# Patient Record
Sex: Male | Born: 2005 | Race: Black or African American | Hispanic: No | Marital: Single | Smoking: Current every day smoker
Health system: Southern US, Community
[De-identification: ages and names within clinical notes are randomized; demographics above are authoritative.]

## PROBLEM LIST (undated history)

## (undated) DIAGNOSIS — K219 Gastro-esophageal reflux disease without esophagitis: Secondary | ICD-10-CM

## (undated) DIAGNOSIS — F909 Attention-deficit hyperactivity disorder, unspecified type: Secondary | ICD-10-CM

## (undated) DIAGNOSIS — T7840XA Allergy, unspecified, initial encounter: Secondary | ICD-10-CM

## (undated) DIAGNOSIS — Z9109 Other allergy status, other than to drugs and biological substances: Secondary | ICD-10-CM

## (undated) HISTORY — PX: TONSILLECTOMY AND ADENOIDECTOMY: SUR1326

## (undated) HISTORY — PX: TYMPANOSTOMY TUBE PLACEMENT: SHX32

## (undated) HISTORY — PX: TONSILECTOMY, ADENOIDECTOMY, BILATERAL MYRINGOTOMY AND TUBES: SHX2538

---
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
## 2024-02-26 ENCOUNTER — Ambulatory Visit

## 2024-02-26 NOTE — Progress Notes (Deleted)
 New patient visit   Patient: Steve Chung   DOB: 11/22/2005   18 y.o. Male  MRN: [id number] Visit Date: 02/26/2024  Today's healthcare provider: Isaiah DELENA Pepper, MD   No chief complaint on file.  Subjective    Steve Chung is a 18 y.o. male who presents today as a new patient to establish care.   Discussed the use of AI scribe software for clinical note transcription with the patient, who gave verbal consent to proceed.  History of Present Illness      Past Medical History:  Diagnosis Date   ADHD (attention deficit hyperactivity disorder)    Allergy    Asthma    Environmental allergies    GERD (gastroesophageal reflux disease)    Past Surgical History:  Procedure Laterality Date   TONSILECTOMY, ADENOIDECTOMY, BILATERAL MYRINGOTOMY AND TUBES     TONSILLECTOMY AND ADENOIDECTOMY     TYMPANOSTOMY TUBE PLACEMENT     Family Status  Relation Name Status   Mother Wyline Mace Alive   Father Reichen Hutzler Deceased   Brother Gershon Mace Alive   Brother Kamier [place] Alive   Mat Aunt  (Not Specified)   MGF  (Not Specified)  No partnership data on file   Family History  Problem Relation Age of Onset   Autism spectrum disorder Brother    Asthma Maternal Aunt    Asthma Maternal Grandfather    Social History   Socioeconomic History   Marital status: Single    Spouse name: Not on file   Number of children: Not on file   Years of education: Not on file   Highest education level: Not on file  Occupational History   Not on file  Tobacco Use   Smoking status: Every Day    Types: E-cigarettes    Passive exposure: Yes   Smokeless tobacco: Current   Tobacco comments:    Vaping   Vaping Use   Vaping status: Never Used  Substance and Sexual Activity   Alcohol use: No    Alcohol/week: 0.0 standard drinks of alcohol   Drug use: No   Sexual activity: Never  Other Topics Concern   Not on file  Social History Narrative   Lives with mother and brothers; father  is deceased as of July 05, 2019.   Social Drivers of Corporate investment banker Strain: Not on file  Food Insecurity: Not on file  Transportation Needs: Not on file  Physical Activity: Not on file  Stress: Not on file  Social Connections: Not on file   Outpatient Medications Prior to Visit  Medication Sig   albuterol  (VENTOLIN  HFA) 108 (90 Base) MCG/ACT inhaler Inhale 2 puffs into the lungs every 4 (four) hours as needed for wheezing or shortness of breath.   BREZTRI  AEROSPHERE 160-9-4.8 MCG/ACT AERO INHALE 2 PUFFS INTO THE LUNGS IN THE MORNING AND AT BEDTIME. (Patient not taking: Reported on 11/13/2023)   CALCIUM PO Take 1 tablet by mouth daily. (Patient not taking: Reported on 01/27/2022)   cetirizine  (ZYRTEC ) 10 MG tablet Take one tablet by mouth once a day at bedtime to control allergy symptoms   Cholecalciferol (VITAMIN D3 PO) Take 1 tablet by mouth daily. (Patient not taking: Reported on 01/27/2022)   EPINEPHrine  0.3 mg/0.3 mL IJ SOAJ injection Inject 0.3 mg into the muscle as needed for anaphylaxis.   fluticasone  (FLONASE ) 50 MCG/ACT nasal spray PLACE 1-2 SPRAYS INTO BOTH NOSTRILS DAILY. SNIFF ONE SPRAY INTO EACH NOSTRIL ONCE A DAY FOR ALLERGY  CONTROL   Multiple Vitamin (MULTIVITAMIN WITH MINERALS) TABS tablet Take 1 tablet by mouth daily. (Patient not taking: Reported on 01/27/2022)   OVER THE COUNTER MEDICATION Take 1 mL by mouth 2 (two) times daily. Focus for ADHD (Patient not taking: Reported on 01/27/2022)   RETIN-A  0.025 % cream Apply to areas of acne on clean, dry face at bedtime as needed.  Use sunscreen each am. (Patient not taking: Reported on 01/27/2022)   No facility-administered medications prior to visit.   Allergies  Allergen Reactions   Fish Allergy Anaphylaxis   Shellfish Allergy Anaphylaxis and Swelling   Amoxil [Amoxicillin] Other (See Comments)    Per allergy test results   Cat Dander Other (See Comments)    Per allergy test results   Penicillins Other (See  Comments)    Per allergy test results   Mold Extract [Trichophyton Mentagrophyte] Other (See Comments)    Allergic to Grass, trees, mold per allergy test results   Trichophyton Other (See Comments)    Allergic to Grass, trees, mold per allergy test results  Grass, trees, mold    Reviews of Systems as noted in HPI.  {Insert previous labs (optional):23779} {See past labs  Heme  Chem  Endocrine  Serology  Results Review (optional):1}   Objective    There were no vitals taken for this visit. {Insert last BP/Wt (optional):23777}{See vitals history (optional):1}   Physical Exam  Depression Screen    11/07/2022    1:39 PM 08/21/2021    9:02 AM 03/12/2020   12:25 PM 03/07/2019   11:56 AM  PHQ 2/9 Scores  PHQ - 2 Score 1 1 0 0  PHQ- 9 Score 5 3 1 6    No results found for any visits on 02/26/24.  Assessment & Plan      Problem List Items Addressed This Visit   None   Assessment and Plan Assessment & Plan      No follow-ups on file.      Isaiah DELENA Pepper, MD  [facility] [phone] (phone) [phone] (fax)

## 2024-04-03 ENCOUNTER — Ambulatory Visit (INDEPENDENT_AMBULATORY_CARE_PROVIDER_SITE_OTHER)

## 2024-04-03 VITALS — BP 119/70 | HR 60 | Resp 16 | Ht 70.0 in | Wt 156.0 lb

## 2024-04-03 DIAGNOSIS — Z91018 Allergy to other foods: Secondary | ICD-10-CM | POA: Insufficient documentation

## 2024-04-03 DIAGNOSIS — F322 Major depressive disorder, single episode, severe without psychotic features: Secondary | ICD-10-CM | POA: Diagnosis not present

## 2024-04-03 DIAGNOSIS — Z23 Encounter for immunization: Secondary | ICD-10-CM | POA: Diagnosis not present

## 2024-04-03 DIAGNOSIS — F17200 Nicotine dependence, unspecified, uncomplicated: Secondary | ICD-10-CM | POA: Insufficient documentation

## 2024-04-03 DIAGNOSIS — J302 Other seasonal allergic rhinitis: Secondary | ICD-10-CM | POA: Diagnosis not present

## 2024-04-03 DIAGNOSIS — J4540 Moderate persistent asthma, uncomplicated: Secondary | ICD-10-CM

## 2024-04-03 DIAGNOSIS — F17290 Nicotine dependence, other tobacco product, uncomplicated: Secondary | ICD-10-CM

## 2024-04-03 MED ORDER — CETIRIZINE HCL 10 MG PO TABS: ORAL_TABLET | ORAL | 2 refills | Status: AC

## 2024-04-03 MED ORDER — ALBUTEROL SULFATE HFA 108 (90 BASE) MCG/ACT IN AERS: 2.0000 | INHALATION_SPRAY | RESPIRATORY_TRACT | 2 refills | Status: AC | PRN

## 2024-04-03 MED ORDER — BUPROPION HCL ER (XL) 150 MG PO TB24: 150.0000 mg | ORAL_TABLET | Freq: Every day | ORAL | 3 refills | Status: AC

## 2024-04-03 MED ORDER — FLUTICASONE PROPIONATE 50 MCG/ACT NA SUSP: 1.0000 | Freq: Every day | NASAL | 5 refills | Status: AC

## 2024-04-03 NOTE — Progress Notes (Signed)
 New patient visit  Patient: Steve Chung   DOB: 2006-02-04   18 y.o. Male  MRN: [id number] Visit Date: 04/03/2024  Today's healthcare provider: Isaiah DELENA Pepper, MD   Chief Complaint  Patient presents with   New Patient (Initial Visit)    NP/Est Care/Labs/ Asthma   Subjective    Steve Chung is a 18 y.o. male who presents today as a new patient to establish care.   Discussed the use of AI scribe software for clinical note transcription with the patient, who gave verbal consent to proceed.  History of Present Illness Steve Chung is a 18 year old male who presents to establish care.  He is concerned about his liver health after watching a TikTok video. He experiences symptoms of dehydration despite drinking a lot of water, with foamy and yellow urine even after consuming eight bottles of water. He has attempted detoxes but continues to experience these symptoms.  He experiences feelings of being down most days, affecting his motivation to engage in activities outside. He has difficulty sleeping, either struggling to fall asleep or waking up early and being unable to return to sleep. He also describes fluctuations in his appetite, sometimes not wanting to eat and other times overeating. He expresses feelings of inadequacy and thoughts of being better off not being here. He attributes some of these feelings to the loss of his father at age 68.  He has hx of asthma but has not been using his inhaler. He has a history of asthma, which was severe in the past, and experiences shortness of breath and wheezing during exercise, particularly when running. He vapes but does not smoke, and he believes vaping might contribute to his respiratory issues. He does not regularly use his inhaler during exercise despite experiencing symptoms.  He has a history of a significant accident where he fell from a height of 30-40 feet, resulting in a brief loss of consciousness and a friend's severe injury. He did  not sustain any broken bones despite the severity of the accident.  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 He has been out of school for two years and is currently unemployed, which he finds distressing.   Past Medical History:  Diagnosis Date   ADHD (attention deficit hyperactivity disorder)    Allergy    Asthma    Environmental allergies    GERD (gastroesophageal reflux disease)    Past Surgical History:  Procedure Laterality Date   TONSILECTOMY, ADENOIDECTOMY, BILATERAL MYRINGOTOMY AND TUBES     TONSILLECTOMY AND ADENOIDECTOMY     TYMPANOSTOMY TUBE PLACEMENT     Family Status  Relation Name Status   Mother Wyline Mace Alive   Father Tramon Crescenzo Deceased   Brother Gershon Mace Alive   Brother Kamier [place] Alive   Mat Aunt  (Not Specified)   MGF  (Not Specified)  No partnership data on file   Family History  Problem Relation Age of Onset   Autism spectrum disorder Brother    Asthma Maternal Aunt    Asthma Maternal Grandfather    Social History   Socioeconomic History   Marital status: Single    Spouse name: Not on file   Number of children: Not on file   Years of education: Not on file   Highest education level: Not on file  Occupational History   Not on file  Tobacco Use   Smoking status: Every Day  Types: E-cigarettes    Passive exposure: Yes   Smokeless tobacco: Current   Tobacco comments:    Vaping   Vaping Use   Vaping status: Never Used  Substance and Sexual Activity   Alcohol use: No    Alcohol/week: 0.0 standard drinks of alcohol   Drug use: No   Sexual activity: Never  Other Topics Concern   Not on file  Social History Narrative   Lives with mother and brothers; father is deceased as of Jun 14, 2019.   Social Drivers of Corporate Investment Banker Strain: Not on file  Food Insecurity: Not on file  Transportation Needs: Not on file  Physical Activity: Not on file  Stress: Not on file   Social Connections: Not on file   Outpatient Medications Prior to Visit  Medication Sig   EPINEPHrine  0.3 mg/0.3 mL IJ SOAJ injection Inject 0.3 mg into the muscle as needed for anaphylaxis.   OVER THE COUNTER MEDICATION Take 1 mL by mouth 2 (two) times daily. Focus for ADHD   [DISCONTINUED] albuterol  (VENTOLIN  HFA) 108 (90 Base) MCG/ACT inhaler Inhale 2 puffs into the lungs every 4 (four) hours as needed for wheezing or shortness of breath.   [DISCONTINUED] BREZTRI  AEROSPHERE 160-9-4.8 MCG/ACT AERO INHALE 2 PUFFS INTO THE LUNGS IN THE MORNING AND AT BEDTIME.   [DISCONTINUED] cetirizine  (ZYRTEC ) 10 MG tablet Take one tablet by mouth once a day at bedtime to control allergy symptoms   [DISCONTINUED] fluticasone  (FLONASE ) 50 MCG/ACT nasal spray PLACE 1-2 SPRAYS INTO BOTH NOSTRILS DAILY. SNIFF ONE SPRAY INTO EACH NOSTRIL ONCE A DAY FOR ALLERGY CONTROL   [DISCONTINUED] RETIN-A  0.025 % cream Apply to areas of acne on clean, dry face at bedtime as needed.  Use sunscreen each am.   [DISCONTINUED] CALCIUM PO Take 1 tablet by mouth daily. (Patient not taking: Reported on 04/03/2024)   [DISCONTINUED] Cholecalciferol (VITAMIN D3 PO) Take 1 tablet by mouth daily. (Patient not taking: Reported on 04/03/2024)   [DISCONTINUED] Multiple Vitamin (MULTIVITAMIN WITH MINERALS) TABS tablet Take 1 tablet by mouth daily. (Patient not taking: Reported on 04/03/2024)   No facility-administered medications prior to visit.   Allergies  Allergen Reactions   Fish Allergy Anaphylaxis   Shellfish Allergy Anaphylaxis and Swelling   Amoxil [Amoxicillin] Other (See Comments)    Per allergy test results   Cat Dander Other (See Comments)    Per allergy test results   Penicillins Other (See Comments)    Per allergy test results   Mold Extract [Trichophyton Mentagrophyte] Other (See Comments)    Allergic to Grass, trees, mold per allergy test results   Trichophyton Other (See Comments)    Allergic to Grass, trees, mold  per allergy test results  Grass, trees, mold    Reviews of Systems as noted in HPI.      Objective    BP 119/70 (BP Location: Left Arm, Patient Position: Sitting)   Pulse 60   Resp 16   Ht 5' 10 (1.778 m)   Wt 156 lb (70.8 kg)   SpO2 99%   BMI 22.38 kg/m     Physical Exam Constitutional:      Appearance: Normal appearance.  HENT:     Head: Normocephalic and atraumatic.     Mouth/Throat:     Mouth: Mucous membranes are moist.  Eyes:     Pupils: Pupils are equal, round, and reactive to light.  Cardiovascular:     Rate and Rhythm: Normal rate and regular rhythm.  Heart sounds: Normal heart sounds.  Pulmonary:     Effort: Pulmonary effort is normal.     Breath sounds: Normal breath sounds.  Skin:    General: Skin is warm.  Neurological:     General: No focal deficit present.     Mental Status: He is alert.     Depression Screen    11/07/2022    1:39 PM 08/21/2021    9:02 AM 03/12/2020   12:25 PM 03/07/2019   11:56 AM  PHQ 2/9 Scores  PHQ - 2 Score 1 1 0 0  PHQ- 9 Score 5  3  1  6       Data saved with a previous flowsheet row definition   No results found for any visits on 04/03/24.  Assessment & Plan      Problem List Items Addressed This Visit       Respiratory   Moderate persistent asthma without complication - Primary   Relevant Medications   albuterol  (VENTOLIN  HFA) 108 (90 Base) MCG/ACT inhaler     Other   Seasonal allergies   Relevant Medications   fluticasone  (FLONASE ) 50 MCG/ACT nasal spray   cetirizine  (ZYRTEC ) 10 MG tablet   Food allergy   Depression, major, single episode, severe (HCC)   Relevant Medications   buPROPion  (WELLBUTRIN  XL) 150 MG 24 hr tablet   Other Relevant Orders   Comprehensive metabolic panel with GFR   VITAMIN D  25 Hydroxy (Vit-D Deficiency, Fractures)   TSH   Nicotine dependence   Other Visit Diagnoses       Need for vaccination       Relevant Orders   Flu vaccine trivalent PF, 6mos and  older(Flulaval,Afluria,Fluarix,Fluzone) (Completed)   Pneumococcal conjugate vaccine 20-valent (Completed)      Assessment & Plan Asthma, moderate persistent Chronic. Moderate persistent asthma poorly controlled, exacerbated by vaping. Vaping likely contributes to daily respiratory symptoms. Has not been using his inhalers. - Restart albuterol  inhaler as needed. - Encouraged consistent inhaler use during exercise. - Discussed potential switch to Symbicort  if ineffective - Advised reduction in vaping.  Depression, major, severe (HCC) Chronic, uncontrolled. Depression with passive suicidal ideation and ADHD symptoms. No active suicidal ideation or intent. Wellbutrin  recommended for both conditions. - Started Wellbutrin  150mg  XR daily. - Discussed Wellbutrin  side effects, including nausea and reduced appetite. - Ordered labs: vitamin D , liver function, kidney function, electrolytes, thyroid function. - Scheduled follow-up in one month.  Seasonal Allergies Symptoms include mucus production and throat clearing. Previous medications not used consistently. - Restart Zyrtec  daily. - Use Flonase  nasal spray as needed.  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 Recent shrimp consumption without reaction, but caution advised. Has epi-pen available. - Continue to avoid shellfish and fish.  Nicotine dependence (vaping) Nicotine dependence due to vaping. Discussed vaping risks and Wellbutrin 's potential to reduce cravings. - Advised reduction in vaping. - Discussed Wellbutrin  benefits for nicotine cravings.  General Health Maintenance Discussed importance of vaccinations. - Administered flu vaccine. - Administered pneumonia vaccine.   Return in about 4 weeks (around 05/01/2024) for Follow Up.      Isaiah DELENA Pepper, MD  [facility] [phone] (phone) [phone] (fax)

## 2024-04-04 ENCOUNTER — Ambulatory Visit: Payer: Self-pay

## 2024-04-04 LAB — COMPREHENSIVE METABOLIC PANEL WITH GFR
ALT: 19 IU/L (ref 0–44)
AST: 23 IU/L (ref 0–40)
Albumin: 4.7 g/dL (ref 4.3–5.2)
Alkaline Phosphatase: 122 IU/L (ref 51–125)
BUN/Creatinine Ratio: 8 — ABNORMAL LOW (ref 9–20)
BUN: 8 mg/dL (ref 6–20)
Bilirubin Total: 0.3 mg/dL (ref 0.0–1.2)
CO2: 20 mmol/L (ref 20–29)
Calcium: 9.9 mg/dL (ref 8.7–10.2)
Chloride: 105 mmol/L (ref 96–106)
Creatinine, Ser: 1.04 mg/dL (ref 0.76–1.27)
Globulin, Total: 2.7 g/dL (ref 1.5–4.5)
Glucose: 84 mg/dL (ref 70–99)
Potassium: 4.6 mmol/L (ref 3.5–5.2)
Sodium: 138 mmol/L (ref 134–144)
Total Protein: 7.4 g/dL (ref 6.0–8.5)
eGFR: 106 mL/min/1.73 (ref >59)

## 2024-04-04 LAB — VITAMIN D 25 HYDROXY (VIT D DEFICIENCY, FRACTURES): Vit D, 25-Hydroxy: 35.9 ng/mL (ref 30.0–100.0)

## 2024-04-04 LAB — TSH: TSH: 1.19 u[IU]/mL (ref 0.450–4.500)

## 2024-05-07 ENCOUNTER — Ambulatory Visit

## 2024-05-07 NOTE — Progress Notes (Deleted)
" °  ° ° °  Established patient visit   Patient: Steve Chung   DOB: May 02, 2006   18 y.o. Male  MRN: [id number] Visit Date: 05/07/2024  Today's healthcare provider: Isaiah DELENA Pepper, MD   No chief complaint on file.  Subjective    HPI  Discussed the use of AI scribe software for clinical note transcription with the patient, who gave verbal consent to proceed.  History of Present Illness      Medications: Show/hide medication list[1]  Review of Systems as noted in HPI.  {Insert previous labs (optional):23779} {See past labs  Heme  Chem  Endocrine  Serology  Results Review (optional):1}   Objective    There were no vitals taken for this visit. {Insert last BP/Wt (optional):23777}{See vitals history (optional):1}  Physical Exam   No results found for any visits on 05/07/24.  Assessment & Plan     Problem List Items Addressed This Visit   None   Assessment and Plan Assessment & Plan       No follow-ups on file.       Isaiah DELENA Pepper, MD  [facility] [phone] (phone) [phone] (fax)    [1]  Outpatient Medications Prior to Visit  Medication Sig   albuterol  (VENTOLIN  HFA) 108 (90 Base) MCG/ACT inhaler Inhale 2 puffs into the lungs every 4 (four) hours as needed for wheezing or shortness of breath.   buPROPion  (WELLBUTRIN  XL) 150 MG 24 hr tablet Take 1 tablet (150 mg total) by mouth daily.   cetirizine  (ZYRTEC ) 10 MG tablet Take one tablet by mouth once a day at bedtime to control allergy symptoms   EPINEPHrine  0.3 mg/0.3 mL IJ SOAJ injection Inject 0.3 mg into the muscle as needed for anaphylaxis.   fluticasone  (FLONASE ) 50 MCG/ACT nasal spray Place 1-2 sprays into both nostrils daily. Sniff one spray into each nostril once a day for allergy control   OVER THE COUNTER MEDICATION Take 1 mL by mouth 2 (two) times daily. Focus for ADHD   No facility-administered medications prior to visit.   "
